# Patient Record
Sex: Female | Born: 2000 | Race: Black or African American | Hispanic: No | Marital: Single
Health system: Southern US, Community
[De-identification: ages and names within clinical notes are randomized; demographics above are authoritative.]

---
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
## 2021-03-11 IMAGING — DX DG HAND COMPLETE 3+V*R*
3 series · 3 of 3 positions shown · non-contrast
Comparison: None.

CLINICAL DATA: Fourth MCP joint pain

EXAM:
RIGHT HAND - COMPLETE 3+ VIEW

[hand pa]
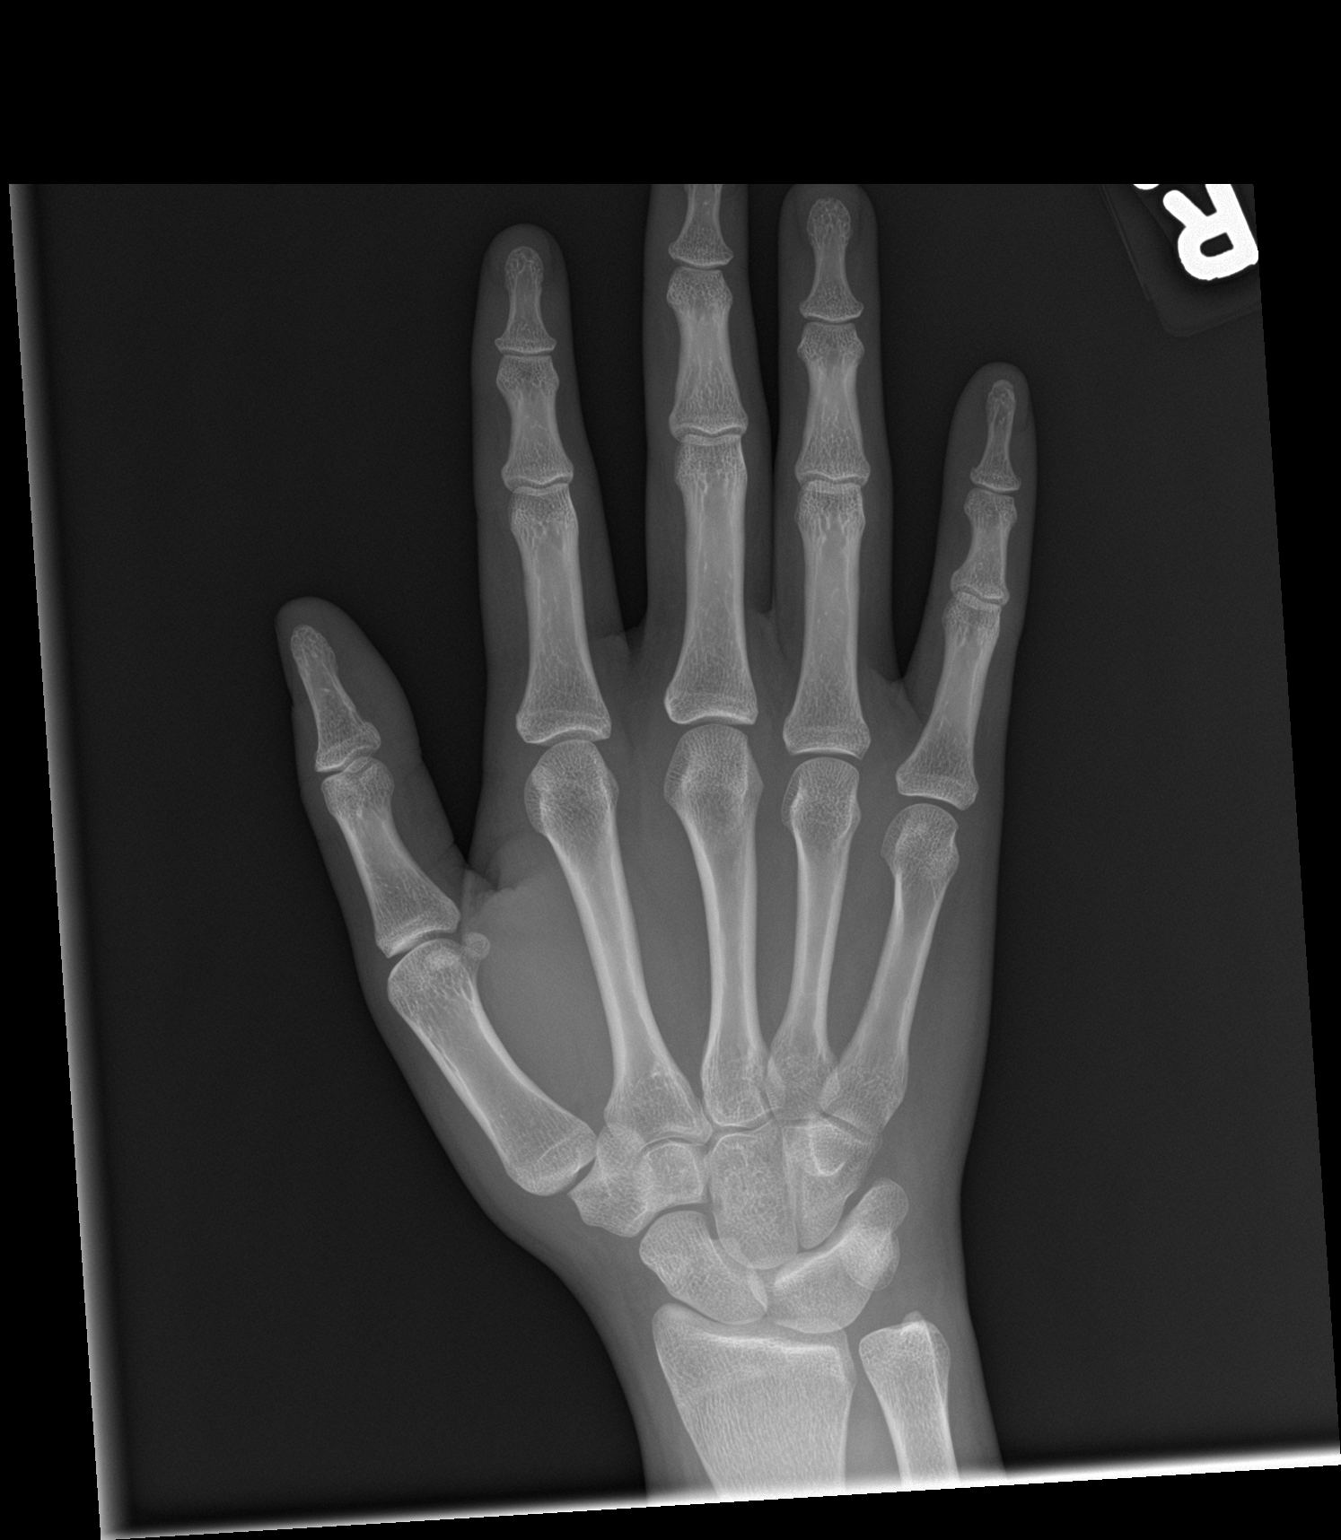

[hand obl]
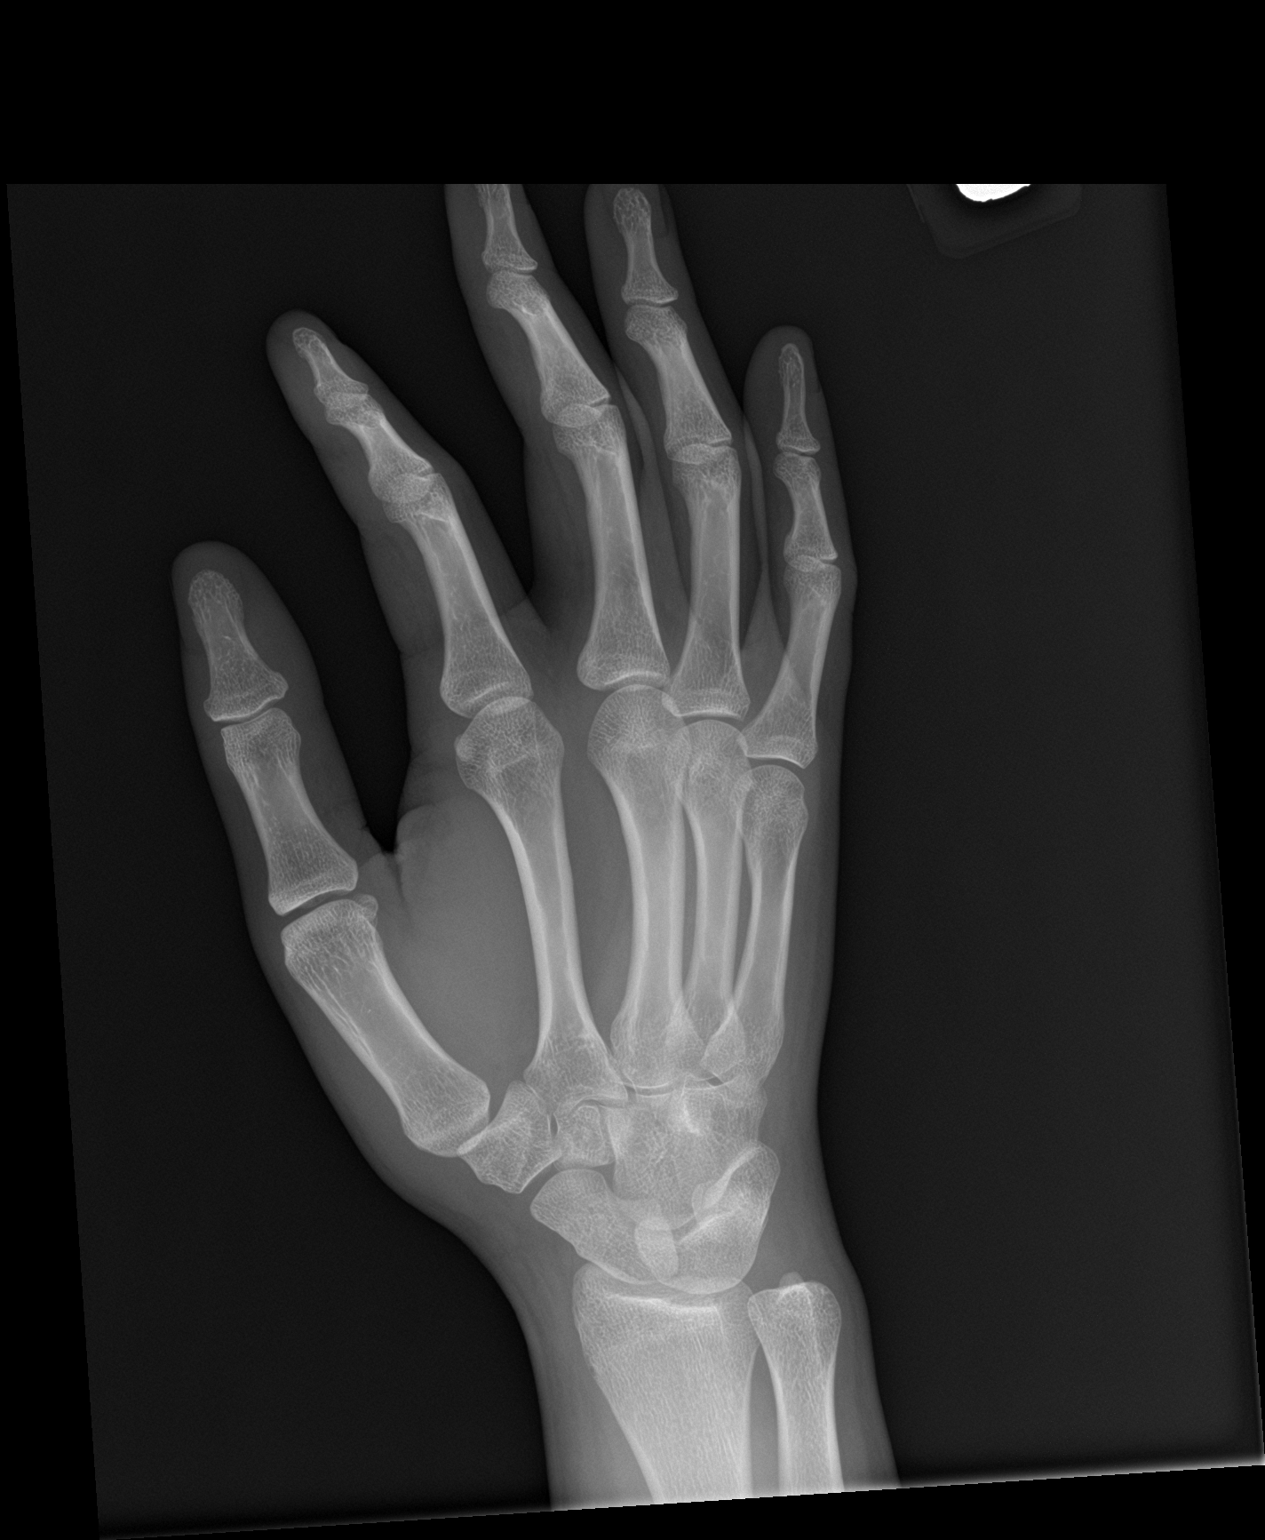

[hand lat]
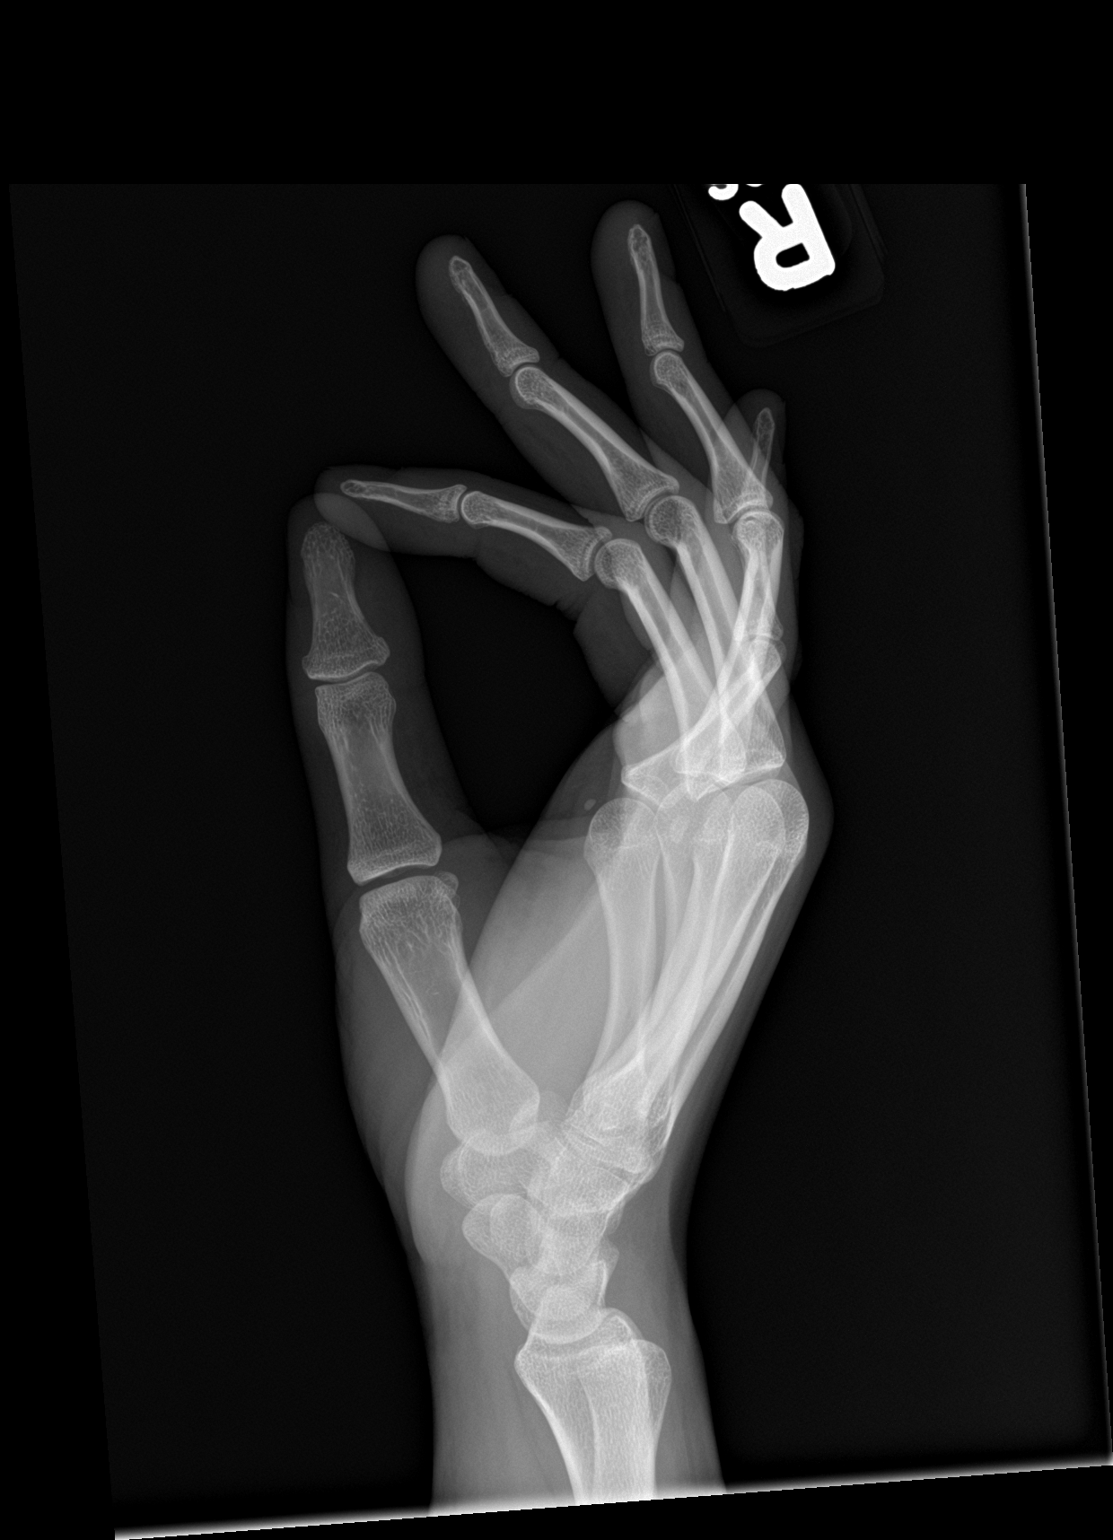

[3 of 3 positions shown; findings below may reference images not displayed]

FINDINGS: There is no evidence of fracture or dislocation. There is no
evidence of arthropathy or other focal bone abnormality. Soft
tissues are unremarkable.
IMPRESSION: Negative.
